# Patient Record
Sex: Male | Born: 2000 | Race: White | Hispanic: No | Marital: Single | Smoking: Never smoker
Health system: Southern US, Community
[De-identification: ages and names within clinical notes are randomized; demographics above are authoritative.]

## PROBLEM LIST (undated history)

## (undated) DIAGNOSIS — Z9889 Other specified postprocedural states: Secondary | ICD-10-CM

## (undated) HISTORY — PX: WISDOM TOOTH EXTRACTION: SHX21

---
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
## 2021-01-22 ENCOUNTER — Other Ambulatory Visit: Payer: Self-pay | Admitting: Gastroenterology

## 2021-09-14 ENCOUNTER — Ambulatory Visit (HOSPITAL_BASED_OUTPATIENT_CLINIC_OR_DEPARTMENT_OTHER)
Admission: RE | Admit: 2021-09-14 | Discharge: 2021-09-14 | Disposition: A | Discharge status: Home / Self Care | Payer: 59 | Source: Ambulatory Visit | Attending: Pediatrics | Admitting: Pediatrics

## 2021-09-14 ENCOUNTER — Other Ambulatory Visit (HOSPITAL_BASED_OUTPATIENT_CLINIC_OR_DEPARTMENT_OTHER): Payer: Self-pay | Admitting: Pediatrics

## 2021-09-14 DIAGNOSIS — M542 Cervicalgia: Secondary | ICD-10-CM | POA: Insufficient documentation

## 2021-09-16 ENCOUNTER — Encounter: Payer: Self-pay | Admitting: Neurology

## 2021-11-19 ENCOUNTER — Ambulatory Visit: Payer: 59 | Admitting: Neurology

## 2021-12-04 ENCOUNTER — Ambulatory Visit: Payer: 59 | Admitting: Neurology

## 2023-07-07 ENCOUNTER — Other Ambulatory Visit: Payer: Self-pay | Admitting: Orthopaedic Surgery

## 2023-07-07 DIAGNOSIS — S92322A Displaced fracture of second metatarsal bone, left foot, initial encounter for closed fracture: Secondary | ICD-10-CM

## 2023-07-08 ENCOUNTER — Ambulatory Visit
Admission: RE | Admit: 2023-07-08 | Discharge: 2023-07-08 | Disposition: A | Discharge status: Home / Self Care | Source: Ambulatory Visit | Attending: Orthopaedic Surgery | Admitting: Orthopaedic Surgery

## 2023-07-08 DIAGNOSIS — S92322A Displaced fracture of second metatarsal bone, left foot, initial encounter for closed fracture: Secondary | ICD-10-CM

## 2023-07-11 ENCOUNTER — Ambulatory Visit
Admission: RE | Admit: 2023-07-11 | Discharge: 2023-07-11 | Disposition: A | Discharge status: Home / Self Care | Source: Ambulatory Visit | Attending: Orthopaedic Surgery | Admitting: Orthopaedic Surgery

## 2023-07-11 DIAGNOSIS — S92322A Displaced fracture of second metatarsal bone, left foot, initial encounter for closed fracture: Secondary | ICD-10-CM
